# Patient Record
Sex: Female | Born: 1970 | Race: White | Hispanic: No | Marital: Married | Smoking: Never smoker
Health system: Southern US, Community
[De-identification: ages and names within clinical notes are randomized; demographics above are authoritative.]

---

## 1997-07-05 ENCOUNTER — Other Ambulatory Visit: Admission: RE | Admit: 1997-07-05 | Discharge: 1997-07-05 | Payer: Self-pay | Admitting: Obstetrics and Gynecology

## 1997-08-30 ENCOUNTER — Encounter: Admission: RE | Admit: 1997-08-30 | Discharge: 1997-11-28 | Payer: Self-pay | Admitting: Obstetrics and Gynecology

## 1998-03-01 ENCOUNTER — Inpatient Hospital Stay (HOSPITAL_COMMUNITY): Admission: AD | Admit: 1998-03-01 | Discharge: 1998-03-04 | Payer: Self-pay | Admitting: Obstetrics and Gynecology

## 1998-04-11 ENCOUNTER — Other Ambulatory Visit: Admission: RE | Admit: 1998-04-11 | Discharge: 1998-04-11 | Payer: Self-pay | Admitting: Obstetrics and Gynecology

## 1999-05-23 ENCOUNTER — Other Ambulatory Visit: Admission: RE | Admit: 1999-05-23 | Discharge: 1999-05-23 | Payer: Self-pay | Admitting: *Deleted

## 2000-02-25 ENCOUNTER — Other Ambulatory Visit: Admission: RE | Admit: 2000-02-25 | Discharge: 2000-02-25 | Payer: Self-pay | Admitting: Obstetrics and Gynecology

## 2000-09-15 ENCOUNTER — Inpatient Hospital Stay (HOSPITAL_COMMUNITY): Admission: AD | Admit: 2000-09-15 | Discharge: 2000-09-18 | Payer: Self-pay | Admitting: Obstetrics and Gynecology

## 2000-10-20 ENCOUNTER — Other Ambulatory Visit: Admission: RE | Admit: 2000-10-20 | Discharge: 2000-10-20 | Payer: Self-pay | Admitting: Obstetrics and Gynecology

## 2000-12-10 ENCOUNTER — Encounter: Admission: RE | Admit: 2000-12-10 | Discharge: 2000-12-10 | Payer: Self-pay | Admitting: Obstetrics and Gynecology

## 2000-12-10 ENCOUNTER — Encounter: Payer: Self-pay | Admitting: Obstetrics and Gynecology

## 2001-09-30 ENCOUNTER — Other Ambulatory Visit: Admission: RE | Admit: 2001-09-30 | Discharge: 2001-09-30 | Payer: Self-pay | Admitting: Obstetrics and Gynecology

## 2002-04-14 ENCOUNTER — Other Ambulatory Visit: Admission: RE | Admit: 2002-04-14 | Discharge: 2002-04-14 | Payer: Self-pay | Admitting: *Deleted

## 2002-04-14 ENCOUNTER — Other Ambulatory Visit: Admission: RE | Admit: 2002-04-14 | Discharge: 2002-04-14 | Payer: Self-pay | Admitting: Obstetrics and Gynecology

## 2002-10-14 ENCOUNTER — Other Ambulatory Visit: Admission: RE | Admit: 2002-10-14 | Discharge: 2002-10-14 | Payer: Self-pay | Admitting: Obstetrics and Gynecology

## 2003-01-31 ENCOUNTER — Other Ambulatory Visit: Admission: RE | Admit: 2003-01-31 | Discharge: 2003-01-31 | Payer: Self-pay | Admitting: Obstetrics & Gynecology

## 2003-06-13 ENCOUNTER — Other Ambulatory Visit: Admission: RE | Admit: 2003-06-13 | Discharge: 2003-06-13 | Payer: Self-pay | Admitting: Obstetrics and Gynecology

## 2004-06-07 ENCOUNTER — Encounter: Admission: RE | Admit: 2004-06-07 | Discharge: 2004-06-07 | Payer: Self-pay | Admitting: Obstetrics and Gynecology

## 2004-06-12 ENCOUNTER — Encounter: Admission: RE | Admit: 2004-06-12 | Discharge: 2004-06-12 | Payer: Self-pay | Admitting: Obstetrics and Gynecology

## 2004-06-12 ENCOUNTER — Encounter (INDEPENDENT_AMBULATORY_CARE_PROVIDER_SITE_OTHER): Payer: Self-pay | Admitting: Specialist

## 2005-01-08 ENCOUNTER — Encounter: Admission: RE | Admit: 2005-01-08 | Discharge: 2005-01-08 | Payer: Self-pay | Admitting: Obstetrics and Gynecology

## 2009-08-24 ENCOUNTER — Encounter: Admission: RE | Admit: 2009-08-24 | Discharge: 2009-08-24 | Payer: Self-pay | Admitting: Obstetrics and Gynecology

## 2010-07-13 NOTE — Op Note (Signed)
[facility]  Patient:    Morgan Cummings, Morgan Cummings                      MRN: [id number] Proc. Date: 09/15/00 Adm. Date:  [id number] Attending:  Maxie Better                           Operative Report  PREOPERATIVE DIAGNOSIS:       Pervious cesarean section, term gestation.  POSTOPERATIVE DIAGNOSES:      1. Previous cesarean section, term gestation.                               2. Omental adhesions.  OPERATION:                    1. Repeat cesarean section.                               2. Lysis of adhesions.  SURGEON:                      Sheronette A. Cherly Hensen, M.D.  ANESTHESIA:                   Spinal.  HISTORY OF PRESENT ILLNESS:   This is a 40 year old, gravida 2, para 1 female at [redacted] weeks gestation with a previous cesarean section who desires an elective repeat C section.  The patient declined vaginal birth attempt.  Risks and benefits of the procedure have been explained to the patient, and consent was signed.  The patient was transferred to the operating room.  DESCRIPTION OF PROCEDURE:     Under adequate spinal anesthesia, the patient was placed in the supine position with a left lateral tilt.  She was sterilely prepped and draped in the usual fashion.  An indwelling Foley catheter had been placed sterilely.  Marcaine 0.25% was injected along the previous scar followed by a Pfannenstiel skin incision through the previous scar, carried on to the rectus fascia.  The rectus fascia was incised in the midline and extended bilaterally using both cautery and Mayo scissors.  The rectus fascia was bluntly with cautery dissected off the muscle in a superior and inferior fashion.  The rectus muscle was subsequently split in the midline.  There was noted at the time omental adhesions to the right anterior wall along the incision line and some of the bladder peritoneum along the right anterior wall as well in the lower aspect.  These were lysed.  The  vesicouterine peritoneum was then opened and extended bilaterally.  The bladder was then bluntly dissected off the low uterine segment and displaced from the operative field using a Doyen retractor.  A curvilinear low transverse uterine incision was then made and extended bilaterally.  Artificial rupture of membranes was noted with clear fluid.  Vertex was floating which necessitated a vacuum application x 3 with subsequent delivery of a live female infant who was bulb suctioned and the abdomen cord clamped and cut.  The baby was transferred to the awaiting pediatricians who subsequently assigned Apgars of 8 and 9 at 1 and 5 minutes, respectively.  The placenta was posterior and manually removed.  The uterus was exteriorized.  The uterine cavity was cleaned of debris.  Normal tubes and ovaries were noted bilaterally.  The uterine incision had no extension and was closed in two layers.  The first layer was a running locked stitch of 0 Monocryl.  The second layer was imbricating using 0 Monocryl.  The uterus was then returned to the abdomen.  The abdomen was irrigated, suctioned of debris. Reinspection of the omentum showed additional lesions superiorly to the right anterior aspect of the abdominal wall with clearly an area for bowel loop herniation.  Using cautery, the omental adhesion was freed from that attachment entirely.  Reinspection of the uterine incision showed good hemostasis.  The bladder area was inspected.  No bleeders noted.  The vesicouterine peritoneum and parietal peritoneum were not closed.  The rectus muscles were inspected.  No bleeders were noted.  The rectus fascia was inspected.  The undersurface was noted superiorly to have a small button hole defect which was closed with 0 Vicryl figure-of-eight suture.  The rectus suture was then closed with 0 Vicryl x 2.  The skin was injected with additional 0.25% Marcaine.  The subcutaneous area was irrigated, small bleeders  cauterized, and the skin approximated using Ethicon staples. Specimen was placenta now sent to pathology.  Estimated blood loss was 700 cc. Urine output was about 150 cc with some blood tinge, otherwise clear at the end of the case.  Intraoperative fluid was 2500 cc crystalloid.  Sponge and instrument count x 2 correct.  Complications none.  The patient tolerated the procedure well and was transferred to the recovery room in stable condition. DD:  09/15/00 TD:  09/16/00 Job: [id number]

## 2010-07-13 NOTE — Discharge Summary (Signed)
[facility]  Patient:    Morgan Cummings, Morgan Cummings                      MRN: [id number] Adm. Date:  [id number] Disc. Date: [id number] Attending:  Maxie Better                           Discharge Summary  ADMISSION DIAGNOSES:          1. Previous cesarean section.                               2. Term gestation.  DISCHARGE DIAGNOSES:          1. Term gestation, delivered.                               2. Repeat cesarean section.                               3. Omental adhesions.  HISTORY OF PRESENT ILLNESS:   Please see the dictated admission history and physical exam. In essence, this is a 40 year old gravida 2, para 1-0-0-1 female at term who desires a repeat cesarean section. Her prenatal course had been uncomplicated.  HOSPITAL COURSE:              The patient was admitted on September 15, 2000. She was taken to the operating room where she underwent a repeat cesarean section via spinal anesthesia. This resulted in the delivery of a live female infant weighing 6 pounds 6 ounces, Apgars of 8 and 9. Omental adhesions were noted and were subsequently lysed. Tubes and ovaries were normal. The patient had an unremarkable postoperative course. Her CBC on postoperative day #1 showed a hemoglobin of 10.9, hematocrit of 31.6, white count of 10.7, platelet count of 185,000. By postoperative day #3 she was passing gas, tolerating a regular diet, had remained afebrile throughout her hospitalization. She was deemed well to be discharged home.  DISPOSITION:                  Home.  CONDITION:                    Stable.  DISCHARGE MEDICATIONS:        1. Tylox, #30, one p.o. q.4h. p.r.n. pain.                               2. Motrin 600 mg one p.o. q.6-8h. p.r.n. pain.                               3. Prenatal vitamins one p.o. q.d.  DISCHARGE INSTRUCTIONS:       The patient is to call for temperature greater than or equal to 100.4, nothing per vagina for four to six  weeks, no heavy lifting or driving for two weeks, call with increased incisional pain, redness, or drainage, severe abdominal pain, soaking a regular pad every hour or more frequently.  DISCHARGE FOLLOWUP:           The patient is to follow up with an appointment with [facility] in four weeks. DD:  10/01/00 TD:  10/01/00 Job: [id number]

## 2011-04-05 ENCOUNTER — Other Ambulatory Visit: Payer: Self-pay | Admitting: Obstetrics and Gynecology

## 2011-04-05 DIAGNOSIS — Z1231 Encounter for screening mammogram for malignant neoplasm of breast: Secondary | ICD-10-CM

## 2011-04-30 ENCOUNTER — Ambulatory Visit
Admission: RE | Admit: 2011-04-30 | Discharge: 2011-04-30 | Disposition: A | Discharge status: Home / Self Care | Payer: No Typology Code available for payment source | Source: Ambulatory Visit | Attending: Obstetrics and Gynecology | Admitting: Obstetrics and Gynecology

## 2011-04-30 DIAGNOSIS — Z1231 Encounter for screening mammogram for malignant neoplasm of breast: Secondary | ICD-10-CM

## 2012-04-15 ENCOUNTER — Other Ambulatory Visit: Payer: Self-pay | Admitting: Obstetrics and Gynecology

## 2012-04-15 DIAGNOSIS — Z1231 Encounter for screening mammogram for malignant neoplasm of breast: Secondary | ICD-10-CM

## 2012-05-08 ENCOUNTER — Ambulatory Visit
Admission: RE | Admit: 2012-05-08 | Discharge: 2012-05-08 | Disposition: A | Discharge status: Home / Self Care | Payer: No Typology Code available for payment source | Source: Ambulatory Visit | Attending: Obstetrics and Gynecology | Admitting: Obstetrics and Gynecology

## 2012-05-08 DIAGNOSIS — Z1231 Encounter for screening mammogram for malignant neoplasm of breast: Secondary | ICD-10-CM

## 2012-10-27 ENCOUNTER — Other Ambulatory Visit: Payer: Self-pay | Admitting: Obstetrics and Gynecology

## 2012-10-27 DIAGNOSIS — N6321 Unspecified lump in the left breast, upper outer quadrant: Secondary | ICD-10-CM

## 2012-10-27 DIAGNOSIS — N6312 Unspecified lump in the right breast, upper inner quadrant: Secondary | ICD-10-CM

## 2012-11-02 ENCOUNTER — Ambulatory Visit
Admission: RE | Admit: 2012-11-02 | Discharge: 2012-11-02 | Disposition: A | Discharge status: Home / Self Care | Payer: No Typology Code available for payment source | Source: Ambulatory Visit | Attending: Obstetrics and Gynecology | Admitting: Obstetrics and Gynecology

## 2012-11-02 DIAGNOSIS — N6321 Unspecified lump in the left breast, upper outer quadrant: Secondary | ICD-10-CM

## 2012-11-13 ENCOUNTER — Other Ambulatory Visit: Payer: No Typology Code available for payment source

## 2013-06-16 ENCOUNTER — Other Ambulatory Visit: Payer: Self-pay

## 2013-06-16 DIAGNOSIS — Z1231 Encounter for screening mammogram for malignant neoplasm of breast: Secondary | ICD-10-CM

## 2013-07-07 ENCOUNTER — Ambulatory Visit
Admission: RE | Admit: 2013-07-07 | Discharge: 2013-07-07 | Disposition: A | Discharge status: Home / Self Care | Payer: No Typology Code available for payment source | Source: Ambulatory Visit

## 2013-07-07 ENCOUNTER — Encounter (INDEPENDENT_AMBULATORY_CARE_PROVIDER_SITE_OTHER): Payer: Self-pay

## 2013-07-07 DIAGNOSIS — Z1231 Encounter for screening mammogram for malignant neoplasm of breast: Secondary | ICD-10-CM

## 2015-09-13 DIAGNOSIS — Z01419 Encounter for gynecological examination (general) (routine) without abnormal findings: Secondary | ICD-10-CM | POA: Diagnosis not present

## 2015-09-13 DIAGNOSIS — Z6820 Body mass index (BMI) 20.0-20.9, adult: Secondary | ICD-10-CM | POA: Diagnosis not present

## 2015-09-13 DIAGNOSIS — Z1151 Encounter for screening for human papillomavirus (HPV): Secondary | ICD-10-CM | POA: Diagnosis not present

## 2015-09-13 DIAGNOSIS — Z1231 Encounter for screening mammogram for malignant neoplasm of breast: Secondary | ICD-10-CM | POA: Diagnosis not present

## 2015-11-10 DIAGNOSIS — L814 Other melanin hyperpigmentation: Secondary | ICD-10-CM | POA: Diagnosis not present

## 2016-09-13 DIAGNOSIS — Z6820 Body mass index (BMI) 20.0-20.9, adult: Secondary | ICD-10-CM | POA: Diagnosis not present

## 2016-09-13 DIAGNOSIS — Z01419 Encounter for gynecological examination (general) (routine) without abnormal findings: Secondary | ICD-10-CM | POA: Diagnosis not present

## 2016-09-13 DIAGNOSIS — Z1151 Encounter for screening for human papillomavirus (HPV): Secondary | ICD-10-CM | POA: Diagnosis not present

## 2016-09-13 DIAGNOSIS — Z1231 Encounter for screening mammogram for malignant neoplasm of breast: Secondary | ICD-10-CM | POA: Diagnosis not present

## 2017-09-24 DIAGNOSIS — Z1329 Encounter for screening for other suspected endocrine disorder: Secondary | ICD-10-CM | POA: Diagnosis not present

## 2017-09-24 DIAGNOSIS — Z6820 Body mass index (BMI) 20.0-20.9, adult: Secondary | ICD-10-CM | POA: Diagnosis not present

## 2017-09-24 DIAGNOSIS — Z13220 Encounter for screening for lipoid disorders: Secondary | ICD-10-CM | POA: Diagnosis not present

## 2017-09-24 DIAGNOSIS — Z0000 Encounter for general adult medical examination without abnormal findings: Secondary | ICD-10-CM | POA: Diagnosis not present

## 2017-09-24 DIAGNOSIS — Z131 Encounter for screening for diabetes mellitus: Secondary | ICD-10-CM | POA: Diagnosis not present

## 2017-09-24 DIAGNOSIS — Z01419 Encounter for gynecological examination (general) (routine) without abnormal findings: Secondary | ICD-10-CM | POA: Diagnosis not present

## 2017-09-24 DIAGNOSIS — Z1231 Encounter for screening mammogram for malignant neoplasm of breast: Secondary | ICD-10-CM | POA: Diagnosis not present

## 2017-09-24 DIAGNOSIS — Z1151 Encounter for screening for human papillomavirus (HPV): Secondary | ICD-10-CM | POA: Diagnosis not present

## 2017-09-24 DIAGNOSIS — Z130 Encounter for screening for diseases of the blood and blood-forming organs and certain disorders involving the immune mechanism: Secondary | ICD-10-CM | POA: Diagnosis not present

## 2017-09-25 ENCOUNTER — Other Ambulatory Visit: Payer: Self-pay | Admitting: Obstetrics and Gynecology

## 2017-09-25 DIAGNOSIS — N63 Unspecified lump in unspecified breast: Secondary | ICD-10-CM

## 2017-10-03 ENCOUNTER — Ambulatory Visit
Admission: RE | Admit: 2017-10-03 | Discharge: 2017-10-03 | Disposition: A | Discharge status: Home / Self Care | Payer: No Typology Code available for payment source | Source: Ambulatory Visit | Attending: Obstetrics and Gynecology | Admitting: Obstetrics and Gynecology

## 2017-10-03 ENCOUNTER — Ambulatory Visit
Admission: RE | Admit: 2017-10-03 | Discharge: 2017-10-03 | Disposition: A | Discharge status: Home / Self Care | Payer: BLUE CROSS/BLUE SHIELD | Source: Ambulatory Visit | Attending: Obstetrics and Gynecology | Admitting: Obstetrics and Gynecology

## 2017-10-03 DIAGNOSIS — N63 Unspecified lump in unspecified breast: Secondary | ICD-10-CM

## 2017-10-03 DIAGNOSIS — N6001 Solitary cyst of right breast: Secondary | ICD-10-CM | POA: Diagnosis not present

## 2017-10-03 DIAGNOSIS — R922 Inconclusive mammogram: Secondary | ICD-10-CM | POA: Diagnosis not present

## 2017-10-20 DIAGNOSIS — N390 Urinary tract infection, site not specified: Secondary | ICD-10-CM | POA: Diagnosis not present

## 2017-10-20 DIAGNOSIS — B9620 Unspecified Escherichia coli [E. coli] as the cause of diseases classified elsewhere: Secondary | ICD-10-CM | POA: Diagnosis not present

## 2017-12-01 DIAGNOSIS — R3129 Other microscopic hematuria: Secondary | ICD-10-CM | POA: Diagnosis not present

## 2018-10-12 DIAGNOSIS — Z1151 Encounter for screening for human papillomavirus (HPV): Secondary | ICD-10-CM | POA: Diagnosis not present

## 2018-10-12 DIAGNOSIS — Z681 Body mass index (BMI) 19 or less, adult: Secondary | ICD-10-CM | POA: Diagnosis not present

## 2018-10-12 DIAGNOSIS — Z01419 Encounter for gynecological examination (general) (routine) without abnormal findings: Secondary | ICD-10-CM | POA: Diagnosis not present

## 2018-10-12 DIAGNOSIS — Z1231 Encounter for screening mammogram for malignant neoplasm of breast: Secondary | ICD-10-CM | POA: Diagnosis not present

## 2018-10-12 DIAGNOSIS — Z124 Encounter for screening for malignant neoplasm of cervix: Secondary | ICD-10-CM | POA: Diagnosis not present

## 2018-10-14 ENCOUNTER — Other Ambulatory Visit: Payer: Self-pay | Admitting: Obstetrics and Gynecology

## 2018-10-14 DIAGNOSIS — N6310 Unspecified lump in the right breast, unspecified quadrant: Secondary | ICD-10-CM

## 2018-10-23 ENCOUNTER — Other Ambulatory Visit: Payer: Self-pay

## 2018-10-23 ENCOUNTER — Ambulatory Visit
Admission: RE | Admit: 2018-10-23 | Discharge: 2018-10-23 | Disposition: A | Discharge status: Home / Self Care | Payer: BLUE CROSS/BLUE SHIELD | Source: Ambulatory Visit | Attending: Obstetrics and Gynecology | Admitting: Obstetrics and Gynecology

## 2018-10-23 DIAGNOSIS — N6001 Solitary cyst of right breast: Secondary | ICD-10-CM | POA: Diagnosis not present

## 2018-10-23 DIAGNOSIS — N6310 Unspecified lump in the right breast, unspecified quadrant: Secondary | ICD-10-CM

## 2018-10-23 DIAGNOSIS — R928 Other abnormal and inconclusive findings on diagnostic imaging of breast: Secondary | ICD-10-CM | POA: Diagnosis not present

## 2018-10-30 DIAGNOSIS — N921 Excessive and frequent menstruation with irregular cycle: Secondary | ICD-10-CM | POA: Diagnosis not present

## 2018-10-30 DIAGNOSIS — R9389 Abnormal findings on diagnostic imaging of other specified body structures: Secondary | ICD-10-CM | POA: Diagnosis not present

## 2018-11-03 DIAGNOSIS — R300 Dysuria: Secondary | ICD-10-CM | POA: Diagnosis not present

## 2018-11-04 DIAGNOSIS — N939 Abnormal uterine and vaginal bleeding, unspecified: Secondary | ICD-10-CM | POA: Diagnosis not present

## 2018-11-04 DIAGNOSIS — N840 Polyp of corpus uteri: Secondary | ICD-10-CM | POA: Diagnosis not present

## 2019-02-17 DIAGNOSIS — U071 COVID-19: Secondary | ICD-10-CM | POA: Diagnosis not present

## 2019-09-06 DIAGNOSIS — J029 Acute pharyngitis, unspecified: Secondary | ICD-10-CM | POA: Diagnosis not present

## 2019-10-04 ENCOUNTER — Other Ambulatory Visit: Payer: Self-pay | Admitting: Obstetrics and Gynecology

## 2019-10-04 DIAGNOSIS — Z1231 Encounter for screening mammogram for malignant neoplasm of breast: Secondary | ICD-10-CM

## 2019-10-25 ENCOUNTER — Ambulatory Visit
Admission: RE | Admit: 2019-10-25 | Discharge: 2019-10-25 | Disposition: A | Discharge status: Home / Self Care | Payer: BC Managed Care – PPO | Source: Ambulatory Visit | Attending: Obstetrics and Gynecology | Admitting: Obstetrics and Gynecology

## 2019-10-25 ENCOUNTER — Other Ambulatory Visit: Payer: Self-pay

## 2019-10-25 DIAGNOSIS — Z1231 Encounter for screening mammogram for malignant neoplasm of breast: Secondary | ICD-10-CM | POA: Diagnosis not present

## 2019-11-02 DIAGNOSIS — Z13220 Encounter for screening for lipoid disorders: Secondary | ICD-10-CM | POA: Diagnosis not present

## 2019-11-02 DIAGNOSIS — N939 Abnormal uterine and vaginal bleeding, unspecified: Secondary | ICD-10-CM | POA: Diagnosis not present

## 2019-11-02 DIAGNOSIS — Z1329 Encounter for screening for other suspected endocrine disorder: Secondary | ICD-10-CM | POA: Diagnosis not present

## 2019-11-02 DIAGNOSIS — Z6820 Body mass index (BMI) 20.0-20.9, adult: Secondary | ICD-10-CM | POA: Diagnosis not present

## 2019-11-02 DIAGNOSIS — N390 Urinary tract infection, site not specified: Secondary | ICD-10-CM | POA: Diagnosis not present

## 2019-11-02 DIAGNOSIS — Z131 Encounter for screening for diabetes mellitus: Secondary | ICD-10-CM | POA: Diagnosis not present

## 2019-11-02 DIAGNOSIS — Z01419 Encounter for gynecological examination (general) (routine) without abnormal findings: Secondary | ICD-10-CM | POA: Diagnosis not present

## 2019-11-02 DIAGNOSIS — Z130 Encounter for screening for diseases of the blood and blood-forming organs and certain disorders involving the immune mechanism: Secondary | ICD-10-CM | POA: Diagnosis not present

## 2019-11-02 DIAGNOSIS — Z0000 Encounter for general adult medical examination without abnormal findings: Secondary | ICD-10-CM | POA: Diagnosis not present

## 2019-11-02 DIAGNOSIS — G43909 Migraine, unspecified, not intractable, without status migrainosus: Secondary | ICD-10-CM | POA: Diagnosis not present

## 2019-11-22 DIAGNOSIS — N921 Excessive and frequent menstruation with irregular cycle: Secondary | ICD-10-CM | POA: Diagnosis not present

## 2019-11-22 DIAGNOSIS — N939 Abnormal uterine and vaginal bleeding, unspecified: Secondary | ICD-10-CM | POA: Diagnosis not present

## 2020-05-09 DIAGNOSIS — Z13228 Encounter for screening for other metabolic disorders: Secondary | ICD-10-CM | POA: Diagnosis not present

## 2020-09-19 ENCOUNTER — Other Ambulatory Visit: Payer: Self-pay | Admitting: Obstetrics and Gynecology

## 2020-09-19 DIAGNOSIS — Z1231 Encounter for screening mammogram for malignant neoplasm of breast: Secondary | ICD-10-CM

## 2020-11-02 DIAGNOSIS — N3020 Other chronic cystitis without hematuria: Secondary | ICD-10-CM | POA: Diagnosis not present

## 2020-11-06 DIAGNOSIS — Z131 Encounter for screening for diabetes mellitus: Secondary | ICD-10-CM | POA: Diagnosis not present

## 2020-11-06 DIAGNOSIS — Z1329 Encounter for screening for other suspected endocrine disorder: Secondary | ICD-10-CM | POA: Diagnosis not present

## 2020-11-06 DIAGNOSIS — Z13220 Encounter for screening for lipoid disorders: Secondary | ICD-10-CM | POA: Diagnosis not present

## 2020-11-06 DIAGNOSIS — Z0000 Encounter for general adult medical examination without abnormal findings: Secondary | ICD-10-CM | POA: Diagnosis not present

## 2020-11-06 DIAGNOSIS — Z6820 Body mass index (BMI) 20.0-20.9, adult: Secondary | ICD-10-CM | POA: Diagnosis not present

## 2020-11-06 DIAGNOSIS — Z01419 Encounter for gynecological examination (general) (routine) without abnormal findings: Secondary | ICD-10-CM | POA: Diagnosis not present

## 2020-11-10 ENCOUNTER — Ambulatory Visit
Admission: RE | Admit: 2020-11-10 | Discharge: 2020-11-10 | Disposition: A | Discharge status: Home / Self Care | Payer: BC Managed Care – PPO | Source: Ambulatory Visit | Attending: Obstetrics and Gynecology | Admitting: Obstetrics and Gynecology

## 2020-11-10 ENCOUNTER — Other Ambulatory Visit: Payer: Self-pay

## 2020-11-10 DIAGNOSIS — Z1231 Encounter for screening mammogram for malignant neoplasm of breast: Secondary | ICD-10-CM

## 2020-12-19 DIAGNOSIS — E875 Hyperkalemia: Secondary | ICD-10-CM | POA: Diagnosis not present

## 2021-01-01 LAB — HM COLONOSCOPY

## 2021-01-11 DIAGNOSIS — Z800 Family history of malignant neoplasm of digestive organs: Secondary | ICD-10-CM | POA: Diagnosis not present

## 2021-01-11 DIAGNOSIS — K5900 Constipation, unspecified: Secondary | ICD-10-CM | POA: Diagnosis not present

## 2021-01-11 DIAGNOSIS — Z1211 Encounter for screening for malignant neoplasm of colon: Secondary | ICD-10-CM | POA: Diagnosis not present

## 2021-04-13 DIAGNOSIS — Z1211 Encounter for screening for malignant neoplasm of colon: Secondary | ICD-10-CM | POA: Diagnosis not present

## 2021-04-13 DIAGNOSIS — Z800 Family history of malignant neoplasm of digestive organs: Secondary | ICD-10-CM | POA: Diagnosis not present

## 2021-04-18 DIAGNOSIS — N63 Unspecified lump in unspecified breast: Secondary | ICD-10-CM | POA: Diagnosis not present

## 2021-04-20 ENCOUNTER — Other Ambulatory Visit: Payer: Self-pay | Admitting: Obstetrics and Gynecology

## 2021-04-20 DIAGNOSIS — N6320 Unspecified lump in the left breast, unspecified quadrant: Secondary | ICD-10-CM

## 2021-05-08 ENCOUNTER — Ambulatory Visit: Payer: BC Managed Care – PPO | Admitting: Nurse Practitioner

## 2021-05-08 ENCOUNTER — Other Ambulatory Visit: Payer: Self-pay

## 2021-05-08 ENCOUNTER — Encounter: Payer: Self-pay | Admitting: Nurse Practitioner

## 2021-05-08 VITALS — BP 106/64 | HR 87 | Temp 97.8°F | Ht 63.6 in | Wt 122.6 lb

## 2021-05-08 DIAGNOSIS — Z800 Family history of malignant neoplasm of digestive organs: Secondary | ICD-10-CM

## 2021-05-08 DIAGNOSIS — Z0000 Encounter for general adult medical examination without abnormal findings: Secondary | ICD-10-CM

## 2021-05-08 NOTE — Progress Notes (Addendum)
? ?Subjective:  ? ? Patient ID: Morgan Cummings, female    DOB: April 27, 1970, 51 y.o.   MRN: [id number] ? ?Patient presents today for CPE (new patient)  ? ?HPI ?FHx: colon cancer ?last done 04/16/2021 by Dr. Loreta AveMann, normal per patient ?Report requested ?Labs completed by GYN 10/2020: CBC, CMP, lipid panel ? ?Vision:up to date, corrective lens ?Dental: up to date ?Exercise:4x/week: cardio and weight training ?Weight:  ?Wt Readings from Last 3 Encounters:  ?05/08/21 122 lb 9.6 oz (55.6 kg)  ? ?Sexual History: ?STD SCREEN:declined ?Mammogram: up to date by GYN ?Cervical cancer: up to date, by GYN ? ?Depression/Suicide: ?Depression screen [facility] 2/9 05/08/2021  ?Decreased Interest 0  ?Down, Depressed, Hopeless 0  ?PHQ - 2 Score 0  ?Altered sleeping 1  ?Tired, decreased energy 0  ?Change in appetite 0  ?Feeling bad or failure about yourself  0  ?Trouble concentrating 0  ?Moving slowly or fidgety/restless 0  ?Suicidal thoughts 0  ?PHQ-9 Score 1  ?Difficult doing work/chores Not difficult at all  ? ?Immunizations: (TDAP, Hep C screen, Pneumovax, Influenza, zoster)  ?Health Maintenance  ?Topic Date Due  ? Tetanus Vaccine  Never done  ? Pap Smear  Never done  ? COVID-19 Vaccine (5 - Booster for Moderna series) 02/02/2021  ? Zoster (Shingles) Vaccine (1 of 2) 08/08/2021*  ? HIV Screening  05/09/2022*  ? Mammogram  11/11/2022  ? Colon Cancer Screening  04/13/2028  ? Flu Shot  Completed  ? HPV Vaccine  Aged Out  ? Hepatitis C Screening: USPSTF Recommendation to screen - Ages 6318-79 yo.  Discontinued  ?*Topic was postponed. The date shown is not the original due date.  ? ?Fall Risk: ?Fall Risk  05/08/2021  ?Falls in the past year? 1  ?Number falls in past yr: 0  ?Injury with Fall? 1  ?Risk for fall due to : History of fall(s)  ?Follow up Falls evaluation completed  ? ?Medications and allergies reviewed with patient and updated if appropriate. ? ?Patient Active Problem List  ? Diagnosis Date Noted  ? FHx: colon cancer 05/08/2021  ? ?Current  Outpatient Medications on File Prior to Visit  ?Medication Sig Dispense Refill  ? SUMAtriptan (IMITREX) 50 MG tablet Take by mouth.    ? ?No current facility-administered medications on file prior to visit.  ? ?History reviewed. No pertinent past medical history. ? ?History reviewed. No pertinent surgical history. ? ?Social History  ? ?Socioeconomic History  ? Marital status: Married  ?  Spouse name: Not on file  ? Number of children: 2  ? Years of education: Not on file  ? Highest education level: Not on file  ?Occupational History  ? Not on file  ?Tobacco Use  ? Smoking status: Never  ? Smokeless tobacco: Never  ?Substance and Sexual Activity  ? Alcohol use: Yes  ?  Comment: occassionally  ? Drug use: Never  ? Sexual activity: Yes  ?  Birth control/protection: None  ?Other Topics Concern  ? Not on file  ?Social History Narrative  ? Not on file  ? ?Social Determinants of Health  ? ?Financial Resource Strain: Not on file  ?Food Insecurity: Not on file  ?Transportation Needs: Not on file  ?Physical Activity: Not on file  ?Stress: Not on file  ?Social Connections: Not on file  ? ? ?Family History  ?Problem Relation Age of Onset  ? Cancer Mother   ?     kidney  ? Hyperlipidemia Father   ? Heart disease  Father 70  ?     CAD with CABG  ? Hypertension Father   ? Cancer Father   ?     prostate  ? Cancer Paternal Uncle 63  ?     colon cancer  ? Cancer Paternal Grandmother 18  ?     colon cancer  ? Obesity Paternal Grandfather   ? Alcohol abuse Paternal Grandfather   ? Breast cancer Neg Hx   ? ?   ? ?Review of Systems  ?Constitutional:  Negative for fever, malaise/fatigue and weight loss.  ?HENT:  Negative for congestion and sore throat.   ?Eyes:   ?     Negative for visual changes  ?Respiratory:  Negative for cough and shortness of breath.   ?Cardiovascular:  Negative for chest pain, palpitations and leg swelling.  ?Gastrointestinal:  Negative for blood in stool, constipation, diarrhea and heartburn.  ?Genitourinary:   Negative for dysuria, frequency and urgency.  ?Musculoskeletal:  Negative for falls, joint pain and myalgias.  ?Skin:  Negative for rash.  ?Neurological:  Negative for dizziness, sensory change and headaches.  ?Endo/Heme/Allergies:  Does not bruise/bleed easily.  ?Psychiatric/Behavioral:  Negative for depression, substance abuse and suicidal ideas. The patient is not nervous/anxious and does not have insomnia.   ? ?Objective:  ? ?Vitals:  ? 05/08/21 0912  ?BP: 106/64  ?Pulse: 87  ?Temp: 97.8 ?F (36.6 ?C)  ?SpO2: 99%  ? ?Body mass index is 21.31 kg/m?. ? ?Physical Examination: ? ?Physical Exam ?Constitutional:   ?   General: She is not in acute distress. ?HENT:  ?   Right Ear: Tympanic membrane, ear canal and external ear normal.  ?   Left Ear: Tympanic membrane, ear canal and external ear normal.  ?Eyes:  ?   General: No scleral icterus. ?   Extraocular Movements: Extraocular movements intact.  ?   Conjunctiva/sclera: Conjunctivae normal.  ?Cardiovascular:  ?   Rate and Rhythm: Normal rate and regular rhythm.  ?   Pulses: Normal pulses.  ?   Heart sounds: Normal heart sounds.  ?Pulmonary:  ?   Effort: Pulmonary effort is normal. No respiratory distress.  ?   Breath sounds: Normal breath sounds.  ?Abdominal:  ?   General: Bowel sounds are normal. There is no distension.  ?   Palpations: Abdomen is soft.  ?Genitourinary: ?   Comments: Deferred breast and pelvic exam to GYN ?Musculoskeletal:     ?   General: Normal range of motion.  ?   Cervical back: Normal range of motion and neck supple.  ?   Right lower leg: No edema.  ?   Left lower leg: No edema.  ?Lymphadenopathy:  ?   Cervical: No cervical adenopathy.  ?Skin: ?   General: Skin is warm and dry.  ?Neurological:  ?   Mental Status: She is alert and oriented to person, place, and time.  ?Psychiatric:     ?   Mood and Affect: Mood normal.     ?   Behavior: Behavior normal.     ?   Thought Content: Thought content normal.  ? ?ASSESSMENT and PLAN: ?This visit occurred  during the SARS-CoV-2 public health emergency.  Safety protocols were in place, including screening questions prior to the visit, additional usage of staff PPE, and extensive cleaning of exam room while observing appropriate contact time as indicated for disinfecting solutions.  ? ?Yuridia was seen today for establish care. ? ?Diagnoses and all orders for this visit: ? ?Preventative health  care ? ?FHx: colon cancer ? ?Labs completed by GYN within the last 39months (normal per patient). Records requested ? ?  ? ?Problem List Items Addressed This Visit   ? ?  ? Other  ? FHx: colon cancer  ?  last done 04/16/2021 by Dr. Loreta Ave, normal per patient ?Report requested ?  ?  ? ?Other Visit Diagnoses   ? ? Preventative health care    -  Primary  ? ?  ?  ? ?Follow up: Return in about 1 year (around 05/09/2022) for CPE (fasting). ? ?Alysia Penna, NP ?

## 2021-05-08 NOTE — Assessment & Plan Note (Signed)
last done 04/16/2021 by Dr. Collene Mares, normal per patient ?Report requested ?

## 2021-05-08 NOTE — Patient Instructions (Signed)
Thank you for choosing [facility] ? ?Sign medical release form to get your records from Dr. Collene Mares and Dr. Lanny Cramp. ? ?Preventive Care 64-50 Years Old, Female ?Preventive care refers to lifestyle choices and visits with your health care provider that can promote health and wellness. Preventive care visits are also called wellness exams. ?What can I expect for my preventive care visit? ?Counseling ?Your health care provider may ask you questions about your: ?Medical history, including: ?Past medical problems. ?Family medical history. ?Pregnancy history. ?Current health, including: ?Menstrual cycle. ?Method of birth control. ?Emotional well-being. ?Home life and relationship well-being. ?Sexual activity and sexual health. ?Lifestyle, including: ?Alcohol, nicotine or tobacco, and drug use. ?Access to firearms. ?Diet, exercise, and sleep habits. ?Work and work Statistician. ?Sunscreen use. ?Safety issues such as seatbelt and bike helmet use. ?Physical exam ?Your health care provider will check your: ?Height and weight. These may be used to calculate your BMI (body mass index). BMI is a measurement that tells if you are at a healthy weight. ?Waist circumference. This measures the distance around your waistline. This measurement also tells if you are at a healthy weight and may help predict your risk of certain diseases, such as type 2 diabetes and high blood pressure. ?Heart rate and blood pressure. ?Body temperature. ?Skin for abnormal spots. ?What immunizations do I need? ?Vaccines are usually given at various ages, according to a schedule. Your health care provider will recommend vaccines for you based on your age, medical history, and lifestyle or other factors, such as travel or where you work. ?What tests do I need? ?Screening ?Your health care provider may recommend screening tests for certain conditions. This may include: ?Lipid and cholesterol levels. ?Diabetes screening. This is done by checking your blood  sugar (glucose) after you have not eaten for a while (fasting). ?Pelvic exam and Pap test. ?Hepatitis B test. ?Hepatitis C test. ?HIV (human immunodeficiency virus) test. ?STI (sexually transmitted infection) testing, if you are at risk. ?Lung cancer screening. ?Colorectal cancer screening. ?Mammogram. Talk with your health care provider about when you should start having regular mammograms. This may depend on whether you have a family history of breast cancer. ?BRCA-related cancer screening. This may be done if you have a family history of breast, ovarian, tubal, or peritoneal cancers. ?Bone density scan. This is done to screen for osteoporosis. ?Talk with your health care provider about your test results, treatment options, and if necessary, the need for more tests. ?Follow these instructions at home: ?Eating and drinking ? ?Eat a diet that includes fresh fruits and vegetables, whole grains, lean protein, and low-fat dairy products. ?Take vitamin and mineral supplements as recommended by your health care provider. ?Do not drink alcohol if: ?Your health care provider tells you not to drink. ?You are pregnant, may be pregnant, or are planning to become pregnant. ?If you drink alcohol: ?Limit how much you have to 0-1 drink a day. ?Know how much alcohol is in your drink. In the [place] one drink equals one 12 oz bottle of beer (355 mL), one 5 oz glass of wine (148 mL), or one 1? oz glass of hard liquor (44 mL). ?Lifestyle ?Brush your teeth every morning and night with fluoride toothpaste. Floss one time each day. ?Exercise for at least 30 minutes 5 or more days each week. ?Do not use any products that contain nicotine or tobacco. These products include cigarettes, chewing tobacco, and vaping devices, such as e-cigarettes. If you need help quitting, ask your health care  provider. ?Do not use drugs. ?If you are sexually active, practice safe sex. Use a condom or other form of protection to prevent STIs. ?If you do not  wish to become pregnant, use a form of birth control. If you plan to become pregnant, see your health care provider for a prepregnancy visit. ?Take aspirin only as told by your health care provider. Make sure that you understand how much to take and what form to take. Work with your health care provider to find out whether it is safe and beneficial for you to take aspirin daily. ?Find healthy ways to manage stress, such as: ?Meditation, yoga, or listening to music. ?Journaling. ?Talking to a trusted person. ?Spending time with friends and family. ?Minimize exposure to UV radiation to reduce your risk of skin cancer. ?Safety ?Always wear your seat belt while driving or riding in a vehicle. ?Do not drive: ?If you have been drinking alcohol. Do not ride with someone who has been drinking. ?When you are tired or distracted. ?While texting. ?If you have been using any mind-altering substances or drugs. ?Wear a helmet and other protective equipment during sports activities. ?If you have firearms in your house, make sure you follow all gun safety procedures. ?Seek help if you have been physically or sexually abused. ?What's next? ?Visit your health care provider once a year for an annual wellness visit. ?Ask your health care provider how often you should have your eyes and teeth checked. ?Stay up to date on all vaccines. ?This information is not intended to replace advice given to you by your health care provider. Make sure you discuss any questions you have with your health care provider. ?Document Revised: 08/09/2020 Document Reviewed: 08/09/2020 ?Elsevier Patient Education ? [employer]. ? ?

## 2021-05-10 ENCOUNTER — Ambulatory Visit
Admission: RE | Admit: 2021-05-10 | Discharge: 2021-05-10 | Disposition: A | Discharge status: Home / Self Care | Payer: BC Managed Care – PPO | Source: Ambulatory Visit | Attending: Obstetrics and Gynecology | Admitting: Obstetrics and Gynecology

## 2021-05-10 DIAGNOSIS — N6002 Solitary cyst of left breast: Secondary | ICD-10-CM | POA: Diagnosis not present

## 2021-05-10 DIAGNOSIS — R922 Inconclusive mammogram: Secondary | ICD-10-CM | POA: Diagnosis not present

## 2021-11-05 DIAGNOSIS — N3020 Other chronic cystitis without hematuria: Secondary | ICD-10-CM | POA: Diagnosis not present

## 2021-11-15 DIAGNOSIS — Z0142 Encounter for cervical smear to confirm findings of recent normal smear following initial abnormal smear: Secondary | ICD-10-CM | POA: Diagnosis not present

## 2021-11-15 DIAGNOSIS — Z01419 Encounter for gynecological examination (general) (routine) without abnormal findings: Secondary | ICD-10-CM | POA: Diagnosis not present

## 2021-11-15 DIAGNOSIS — Z01411 Encounter for gynecological examination (general) (routine) with abnormal findings: Secondary | ICD-10-CM | POA: Diagnosis not present

## 2021-11-15 DIAGNOSIS — Z124 Encounter for screening for malignant neoplasm of cervix: Secondary | ICD-10-CM | POA: Diagnosis not present

## 2021-12-11 ENCOUNTER — Other Ambulatory Visit: Payer: Self-pay | Admitting: Obstetrics and Gynecology

## 2021-12-11 DIAGNOSIS — Z1231 Encounter for screening mammogram for malignant neoplasm of breast: Secondary | ICD-10-CM

## 2022-01-28 ENCOUNTER — Ambulatory Visit: Payer: BC Managed Care – PPO

## 2022-01-28 ENCOUNTER — Ambulatory Visit
Admission: RE | Admit: 2022-01-28 | Discharge: 2022-01-28 | Disposition: A | Discharge status: Home / Self Care | Payer: BC Managed Care – PPO | Source: Ambulatory Visit | Attending: Obstetrics and Gynecology | Admitting: Obstetrics and Gynecology

## 2022-01-28 DIAGNOSIS — Z1231 Encounter for screening mammogram for malignant neoplasm of breast: Secondary | ICD-10-CM

## 2022-10-03 ENCOUNTER — Ambulatory Visit (INDEPENDENT_AMBULATORY_CARE_PROVIDER_SITE_OTHER): Payer: BC Managed Care – PPO | Admitting: Nurse Practitioner

## 2022-10-03 ENCOUNTER — Encounter: Payer: Self-pay | Admitting: Nurse Practitioner

## 2022-10-03 VITALS — BP 110/82 | HR 71 | Temp 98.8°F | Resp 16 | Ht 63.6 in | Wt 128.6 lb

## 2022-10-03 DIAGNOSIS — F518 Other sleep disorders not due to a substance or known physiological condition: Secondary | ICD-10-CM

## 2022-10-03 DIAGNOSIS — Z0001 Encounter for general adult medical examination with abnormal findings: Secondary | ICD-10-CM

## 2022-10-03 DIAGNOSIS — Z23 Encounter for immunization: Secondary | ICD-10-CM

## 2022-10-03 DIAGNOSIS — Z13220 Encounter for screening for lipoid disorders: Secondary | ICD-10-CM | POA: Diagnosis not present

## 2022-10-03 DIAGNOSIS — G4720 Circadian rhythm sleep disorder, unspecified type: Secondary | ICD-10-CM

## 2022-10-03 DIAGNOSIS — Z136 Encounter for screening for cardiovascular disorders: Secondary | ICD-10-CM | POA: Diagnosis not present

## 2022-10-03 LAB — COMPREHENSIVE METABOLIC PANEL
ALT: 17 U/L (ref 0–35)
AST: 21 U/L (ref 0–37)
Albumin: 4.4 g/dL (ref 3.5–5.2)
Alkaline Phosphatase: 79 U/L (ref 39–117)
BUN: 20 mg/dL (ref 6–23)
CO2: 29 mEq/L (ref 19–32)
Calcium: 9.1 mg/dL (ref 8.4–10.5)
Chloride: 105 mEq/L (ref 96–112)
Creatinine, Ser: 0.72 mg/dL (ref 0.40–1.20)
GFR: 96.71 mL/min (ref >60.00)
Glucose, Bld: 88 mg/dL (ref 70–99)
Potassium: 4.2 mEq/L (ref 3.5–5.1)
Sodium: 140 mEq/L (ref 135–145)
Total Bilirubin: 0.5 mg/dL (ref 0.2–1.2)
Total Protein: 6.7 g/dL (ref 6.0–8.3)

## 2022-10-03 LAB — LIPID PANEL
Cholesterol: 150 mg/dL (ref 0–200)
HDL: 53.3 mg/dL (ref >39.00)
LDL Cholesterol: 86 mg/dL (ref 0–99)
NonHDL: 96.7
Total CHOL/HDL Ratio: 3
Triglycerides: 55 mg/dL (ref 0.0–149.0)
VLDL: 11 mg/dL (ref 0.0–40.0)

## 2022-10-03 NOTE — Patient Instructions (Addendum)
Go to lab Continue Heart healthy diet and daily exercise. Try magnesium glycinate for sleep  Preventive Care 51-51 Years Old, Female Preventive care refers to lifestyle choices and visits with your health care provider that can promote health and wellness. Preventive care visits are also called wellness exams. What can I expect for my preventive care visit? Counseling Your health care provider may ask you questions about your: Medical history, including: Past medical problems. Family medical history. Pregnancy history. Current health, including: Menstrual cycle. Method of birth control. Emotional well-being. Home life and relationship well-being. Sexual activity and sexual health. Lifestyle, including: Alcohol, nicotine or tobacco, and drug use. Access to firearms. Diet, exercise, and sleep habits. Work and work Astronomer. Sunscreen use. Safety issues such as seatbelt and bike helmet use. Physical exam Your health care provider will check your: Height and weight. These may be used to calculate your BMI (body mass index). BMI is a measurement that tells if you are at a healthy weight. Waist circumference. This measures the distance around your waistline. This measurement also tells if you are at a healthy weight and may help predict your risk of certain diseases, such as type 2 diabetes and high blood pressure. Heart rate and blood pressure. Body temperature. Skin for abnormal spots. What immunizations do I need?  Vaccines are usually given at various ages, according to a schedule. Your health care provider will recommend vaccines for you based on your age, medical history, and lifestyle or other factors, such as travel or where you work. What tests do I need? Screening Your health care provider may recommend screening tests for certain conditions. This may include: Lipid and cholesterol levels. Diabetes screening. This is done by checking your blood sugar (glucose) after you  have not eaten for a while (fasting). Pelvic exam and Pap test. Hepatitis B test. Hepatitis C test. HIV (human immunodeficiency virus) test. STI (sexually transmitted infection) testing, if you are at risk. Lung cancer screening. Colorectal cancer screening. Mammogram. Talk with your health care provider about when you should start having regular mammograms. This may depend on whether you have a family history of breast cancer. BRCA-related cancer screening. This may be done if you have a family history of breast, ovarian, tubal, or peritoneal cancers. Bone density scan. This is done to screen for osteoporosis. Talk with your health care provider about your test results, treatment options, and if necessary, the need for more tests. Follow these instructions at home: Eating and drinking  Eat a diet that includes fresh fruits and vegetables, whole grains, lean protein, and low-fat dairy products. Take vitamin and mineral supplements as recommended by your health care provider. Do not drink alcohol if: Your health care provider tells you not to drink. You are pregnant, may be pregnant, or are planning to become pregnant. If you drink alcohol: Limit how much you have to 0-1 drink a day. Know how much alcohol is in your drink. In the [place] one drink equals one 12 oz bottle of beer (355 mL), one 5 oz glass of wine (148 mL), or one 1 oz glass of hard liquor (44 mL). Lifestyle Brush your teeth every morning and night with fluoride toothpaste. Floss one time each day. Exercise for at least 30 minutes 5 or more days each week. Do not use any products that contain nicotine or tobacco. These products include cigarettes, chewing tobacco, and vaping devices, such as e-cigarettes. If you need help quitting, ask your health care provider. Do not use drugs. If  you are sexually active, practice safe sex. Use a condom or other form of protection to prevent STIs. If you do not wish to become pregnant, use  a form of birth control. If you plan to become pregnant, see your health care provider for a prepregnancy visit. Take aspirin only as told by your health care provider. Make sure that you understand how much to take and what form to take. Work with your health care provider to find out whether it is safe and beneficial for you to take aspirin daily. Find healthy ways to manage stress, such as: Meditation, yoga, or listening to music. Journaling. Talking to a trusted person. Spending time with friends and family. Minimize exposure to UV radiation to reduce your risk of skin cancer. Safety Always wear your seat belt while driving or riding in a vehicle. Do not drive: If you have been drinking alcohol. Do not ride with someone who has been drinking. When you are tired or distracted. While texting. If you have been using any mind-altering substances or drugs. Wear a helmet and other protective equipment during sports activities. If you have firearms in your house, make sure you follow all gun safety procedures. Seek help if you have been physically or sexually abused. What's next? Visit your health care provider once a year for an annual wellness visit. Ask your health care provider how often you should have your eyes and teeth checked. Stay up to date on all vaccines. This information is not intended to replace advice given to you by your health care provider. Make sure you discuss any questions you have with your health care provider. Document Revised: 08/09/2020 Document Reviewed: 08/09/2020 Elsevier Patient Education  2024 [employer].

## 2022-10-03 NOTE — Progress Notes (Signed)
Complete physical exam  Patient: Morgan Cummings   DOB: [birth year]   52 y.o. Female  MRN: [id number] Visit Date: 10/03/2022  Subjective:    Chief Complaint  Patient presents with   Annual Exam    Fasting    Morgan Cummings is a 52 y.o. female who presents today for a complete physical exam. She reports consuming a general diet.  Walking and strength training 5x/week  She generally feels well. She reports sleeping poorly. She does have additional problems to discuss today.  Vision:Yes Dental:Yes STD Screen:No  BP Readings from Last 3 Encounters:  10/03/22 110/82  05/08/21 106/64   Wt Readings from Last 3 Encounters:  10/03/22 128 lb 9.6 oz (58.3 kg)  05/08/21 122 lb 9.6 oz (55.6 kg)   Most recent fall risk assessment:    10/03/2022    9:57 AM  Fall Risk   Falls in the past year? 0  Number falls in past yr: 0  Injury with Fall? 0  Risk for fall due to : No Fall Risks  Follow up Falls evaluation completed   Depression screen:Yes - No Depression Most recent depression screenings:    10/03/2022    9:57 AM 05/08/2021    9:22 AM  PHQ 2/9 Scores  PHQ - 2 Score 0 0  PHQ- 9 Score 3 1    HPI  Disrupted sleep-wake cycle Chronic, no difficulty falling asleep, wakes up between 1-3am every night and unable to return to sleep, associated with intermittent restless legs. She has a bed-partner who snores, use of earplugs with some improvement.  no improvement with melatonin, and perimenopause OVER THE COUNTER supplement. No tobacco/nicotine, no ALCOHOL, no caffeine No daytime somnolence, no snoring, no apneic episodes, no HYPERTENSION, no large neck circumference.  We discussed use of hynoptic and possible side effects. She opted to use magnesium glycinate OVER THE COUNTER supplement at this time. F/up prn   History reviewed. No pertinent past medical history. Past Surgical History:  Procedure Laterality Date   CESAREAN SECTION  03/01/1998   Social History   Socioeconomic  History   Marital status: Married    Spouse name: Not on file   Number of children: 2   Years of education: Not on file   Highest education level: Not on file  Occupational History   Not on file  Tobacco Use   Smoking status: Never   Smokeless tobacco: Never  Substance and Sexual Activity   Alcohol use: Yes    Alcohol/week: 2.0 standard drinks of alcohol    Types: 2 Cans of beer per week    Comment: occassionally   Drug use: Never   Sexual activity: Yes    Birth control/protection: Other-see comments, None    Comment: Husband had vascectomy  Other Topics Concern   Not on file  Social History Narrative   Not on file   Social Determinants of Health   Financial Resource Strain: Not on file  Food Insecurity: Not on file  Transportation Needs: Not on file  Physical Activity: Not on file  Stress: Not on file  Social Connections: Not on file  Intimate Partner Violence: Not on file   Family Status  Relation Name Status   Mother [place] Alive   Father [place] Alive   Sister  Alive   Pat Uncle Ed Deceased   MGM  Deceased   MGF  Deceased   PGM Josephine Deceased   PGF Grandpa Deceased   Neg Hx  (Not Specified)  No partnership data on file   Family History  Problem Relation Age of Onset   Cancer Mother        kidney   Hyperlipidemia Father    Heart disease Father 40       CAD with CABG   Hypertension Father    Cancer Father        prostate   Cancer Paternal Uncle 10       colon cancer   Cancer Paternal Grandmother 11       colon cancer   Obesity Paternal Grandfather    Alcohol abuse Paternal Grandfather    Breast cancer Neg Hx    Allergies  Allergen Reactions   Codeine Nausea And Vomiting   Sulfa Antibiotics Other (See Comments)    Childhood reaction,    Penicillins Rash    Patient Care Team: Farhiya Rosten, Bonna Gains, NP as PCP - General (Internal Medicine) Toy Baker, DO as Consulting Physician (Obstetrics and Gynecology)   Medications: Outpatient  Medications Prior to Visit  Medication Sig   SUMAtriptan (IMITREX) 50 MG tablet Take by mouth.   No facility-administered medications prior to visit.    Review of Systems  Constitutional:  Negative for activity change, appetite change and unexpected weight change.  Respiratory: Negative.    Cardiovascular: Negative.   Gastrointestinal: Negative.   Endocrine: Negative for cold intolerance and heat intolerance.  Genitourinary: Negative.   Musculoskeletal: Negative.   Skin: Negative.   Neurological: Negative.   Hematological: Negative.   Psychiatric/Behavioral:  Positive for sleep disturbance. Negative for behavioral problems, decreased concentration, dysphoric mood, hallucinations, self-injury and suicidal ideas. The patient is not nervous/anxious.         Objective:  BP 110/82 (BP Location: Left Arm, Patient Position: Sitting, Cuff Size: Normal)   Pulse 71   Temp 98.8 F (37.1 C) (Temporal)   Resp 16   Ht 5' 3.6" (1.615 m)   Wt 128 lb 9.6 oz (58.3 kg)   LMP 05/19/2022 (Exact Date)   SpO2 100%   BMI 22.35 kg/m     Physical Exam Vitals and nursing note reviewed.  Constitutional:      General: She is not in acute distress. HENT:     Right Ear: Tympanic membrane, ear canal and external ear normal.     Left Ear: Tympanic membrane, ear canal and external ear normal.     Nose: Nose normal.  Eyes:     Extraocular Movements: Extraocular movements intact.     Conjunctiva/sclera: Conjunctivae normal.     Pupils: Pupils are equal, round, and reactive to light.  Neck:     Thyroid: No thyroid mass, thyromegaly or thyroid tenderness.  Cardiovascular:     Rate and Rhythm: Normal rate and regular rhythm.     Pulses: Normal pulses.     Heart sounds: Normal heart sounds.  Pulmonary:     Effort: Pulmonary effort is normal.     Breath sounds: Normal breath sounds.  Abdominal:     General: Bowel sounds are normal.     Palpations: Abdomen is soft.  Musculoskeletal:         General: Normal range of motion.     Cervical back: Normal range of motion and neck supple.     Right lower leg: No edema.     Left lower leg: No edema.  Lymphadenopathy:     Cervical: No cervical adenopathy.  Skin:    General: Skin is warm and dry.  Neurological:  Mental Status: She is alert and oriented to person, place, and time.     Cranial Nerves: No cranial nerve deficit.  Psychiatric:        Mood and Affect: Mood normal.        Behavior: Behavior normal.        Thought Content: Thought content normal.     Results for orders placed or performed in visit on 10/03/22  Comprehensive metabolic panel  Result Value Ref Range   Sodium 140 135 - 145 mEq/L   Potassium 4.2 3.5 - 5.1 mEq/L   Chloride 105 96 - 112 mEq/L   CO2 29 19 - 32 mEq/L   Glucose, Bld 88 70 - 99 mg/dL   BUN 20 6 - 23 mg/dL   Creatinine, Ser 1.02 0.40 - 1.20 mg/dL   Total Bilirubin 0.5 0.2 - 1.2 mg/dL   Alkaline Phosphatase 79 39 - 117 U/L   AST 21 0 - 37 U/L   ALT 17 0 - 35 U/L   Total Protein 6.7 6.0 - 8.3 g/dL   Albumin 4.4 3.5 - 5.2 g/dL   GFR 72.53 >66.44 mL/min   Calcium 9.1 8.4 - 10.5 mg/dL  Lipid panel  Result Value Ref Range   Cholesterol 150 0 - 200 mg/dL   Triglycerides 03.4 0.0 - 149.0 mg/dL   HDL 74.25 >95.63 mg/dL   VLDL 87.5 0.0 - 64.3 mg/dL   LDL Cholesterol 86 0 - 99 mg/dL   Total CHOL/HDL Ratio 3    NonHDL 96.70       Assessment & Plan:    Routine Health Maintenance and Physical Exam  Immunization History  Administered Date(s) Administered   Influenza-Unspecified 11/25/2020   Moderna Sars-Covid-2 Vaccination 05/05/2019, 06/02/2019, 03/04/2020, 12/08/2020   Tdap 10/03/2022   Health Maintenance  Topic Date Due   PAP SMEAR-Modifier  Never done   COVID-19 Vaccine (5 - 2023-24 season) 10/26/2021   INFLUENZA VACCINE  09/26/2022   Zoster Vaccines- Shingrix (1 of 2) 01/03/2023 (Originally [birth year]/08/1990)   HIV Screening  10/03/2023 (Originally [birth year]/08/1986)   MAMMOGRAM  01/29/2024    Colonoscopy  04/13/2028   DTaP/Tdap/Td (2 - Td or Tdap) 10/02/2032   HPV VACCINES  Aged Out   Hepatitis C Screening  Discontinued   Discussed health benefits of physical activity, and encouraged her to engage in regular exercise appropriate for her age and condition. Advised to schedule mammogram 01/2023 PAP results requested from GYN  Problem List Items Addressed This Visit       Nervous and Auditory   Disrupted sleep-wake cycle    Chronic, no difficulty falling asleep, wakes up between 1-3am every night and unable to return to sleep, associated with intermittent restless legs. She has a bed-partner who snores, use of earplugs with some improvement.  no improvement with melatonin, and perimenopause OVER THE COUNTER supplement. No tobacco/nicotine, no ALCOHOL, no caffeine No daytime somnolence, no snoring, no apneic episodes, no HYPERTENSION, no large neck circumference.  We discussed use of hynoptic and possible side effects. She opted to use magnesium glycinate OVER THE COUNTER supplement at this time. F/up prn      Other Visit Diagnoses     Encounter for preventative adult health care exam with abnormal findings    -  Primary   Relevant Orders   Comprehensive metabolic panel (Completed)   Lipid panel (Completed)   Encounter for immunization       Relevant Orders   Tdap vaccine greater than or equal to 7yo IM (  Completed)   Encounter for lipid screening for cardiovascular disease       Relevant Orders   Lipid panel (Completed)      Return in about 1 year (around 10/03/2023) for CPE (fasting).     Alysia Penna, NP

## 2022-10-03 NOTE — Assessment & Plan Note (Addendum)
Chronic, no difficulty falling asleep, wakes up between 1-3am every night and unable to return to sleep, associated with intermittent restless legs. She has a bed-partner who snores, use of earplugs with some improvement.  no improvement with melatonin, and perimenopause OVER THE COUNTER supplement. No tobacco/nicotine, no ALCOHOL, no caffeine No daytime somnolence, no snoring, no apneic episodes, no HYPERTENSION, no large neck circumference.  We discussed use of hynoptic and possible side effects. She opted to use magnesium glycinate OVER THE COUNTER supplement at this time. F/up prn

## 2022-10-03 NOTE — Progress Notes (Signed)
Stable Follow instructions as discussed during office visit.

## 2022-12-04 DIAGNOSIS — N3020 Other chronic cystitis without hematuria: Secondary | ICD-10-CM | POA: Diagnosis not present

## 2022-12-17 ENCOUNTER — Other Ambulatory Visit: Payer: Self-pay | Admitting: Obstetrics and Gynecology

## 2022-12-17 DIAGNOSIS — Z1231 Encounter for screening mammogram for malignant neoplasm of breast: Secondary | ICD-10-CM

## 2023-02-03 ENCOUNTER — Ambulatory Visit
Admission: RE | Admit: 2023-02-03 | Discharge: 2023-02-03 | Disposition: A | Discharge status: Home / Self Care | Payer: BC Managed Care – PPO | Source: Ambulatory Visit | Attending: Obstetrics and Gynecology | Admitting: Obstetrics and Gynecology

## 2023-02-03 DIAGNOSIS — Z1231 Encounter for screening mammogram for malignant neoplasm of breast: Secondary | ICD-10-CM

## 2023-03-10 DIAGNOSIS — Z1331 Encounter for screening for depression: Secondary | ICD-10-CM | POA: Diagnosis not present

## 2023-03-10 DIAGNOSIS — N951 Menopausal and female climacteric states: Secondary | ICD-10-CM | POA: Diagnosis not present

## 2023-03-10 DIAGNOSIS — Z01419 Encounter for gynecological examination (general) (routine) without abnormal findings: Secondary | ICD-10-CM | POA: Diagnosis not present

## 2023-10-18 IMAGING — MG MM DIGITAL DIAGNOSTIC UNILAT*L* W/ TOMO W/ CAD
6 series · 6 of 18 positions shown · non-contrast
Comparison: Previous exam(s).

CLINICAL DATA: New mass felt by the patient in the inferior left
breast for the past 2.5 weeks.

EXAM:
DIGITAL DIAGNOSTIC UNILATERAL LEFT MAMMOGRAM WITH TOMOSYNTHESIS AND
CAD; ULTRASOUND LEFT BREAST LIMITED
TECHNIQUE: Left digital diagnostic mammography and breast tomosynthesis was
performed. The images were evaluated with computer-aided detection.;
Targeted ultrasound examination of the left breast was performed.

[L MLO synth-2D (1 of 2)]
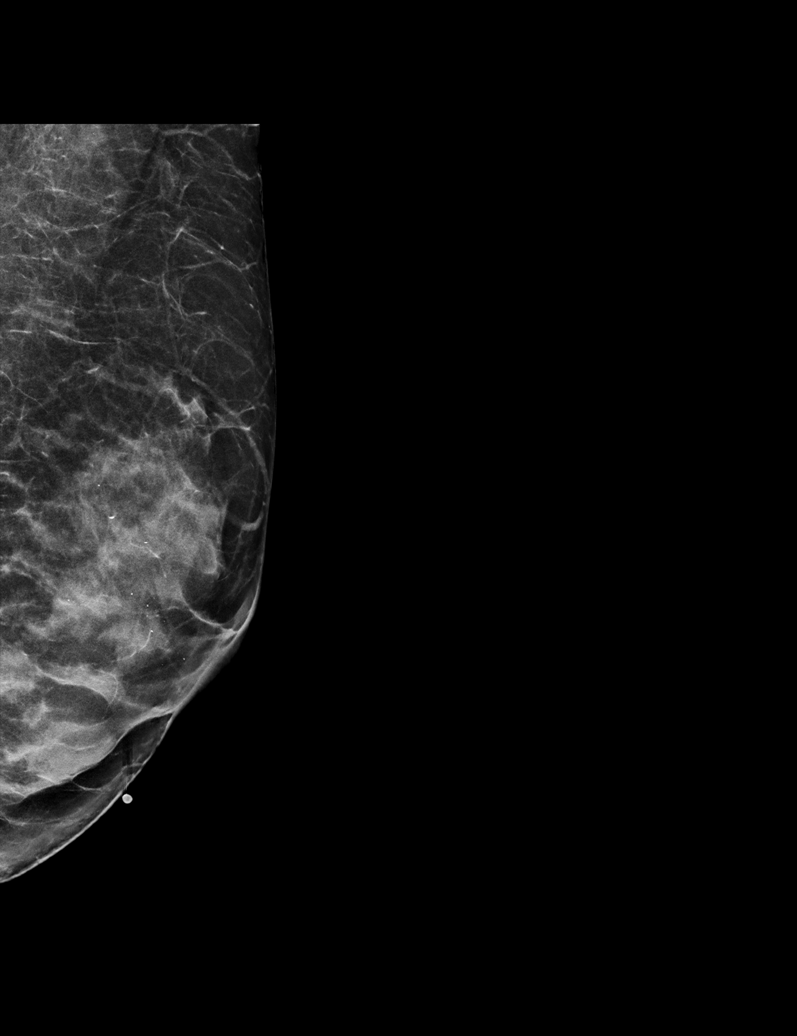

[L MLO synth-2D (2 of 2)]
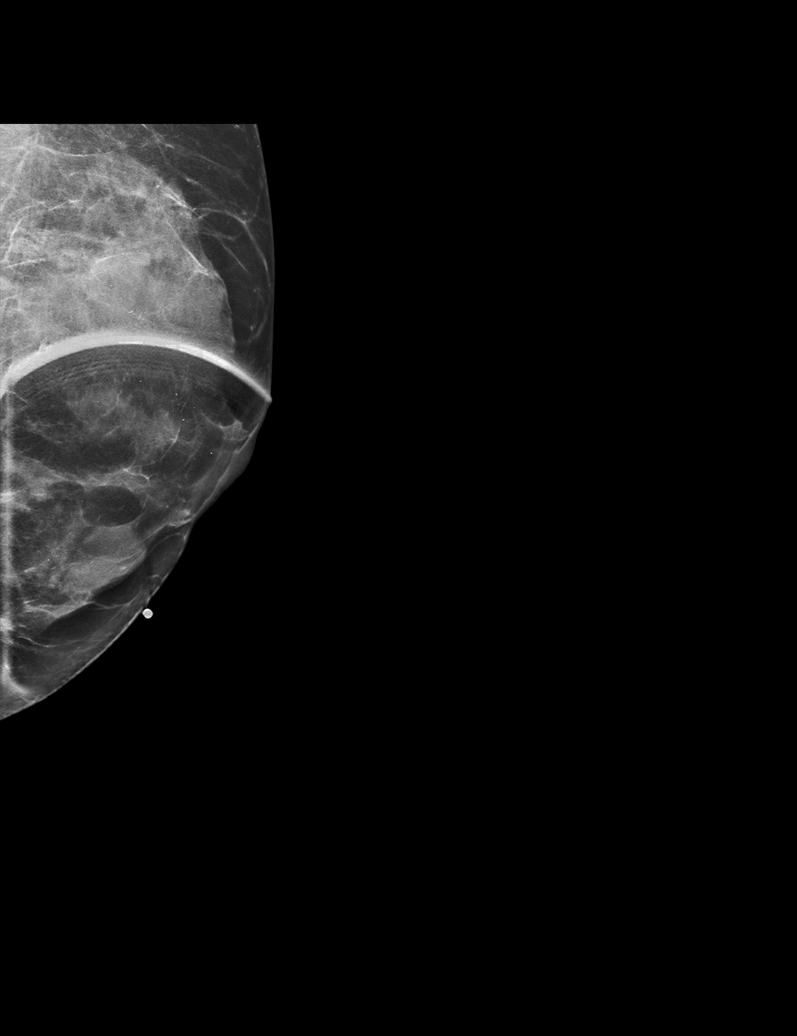

[L CC synth-2D]
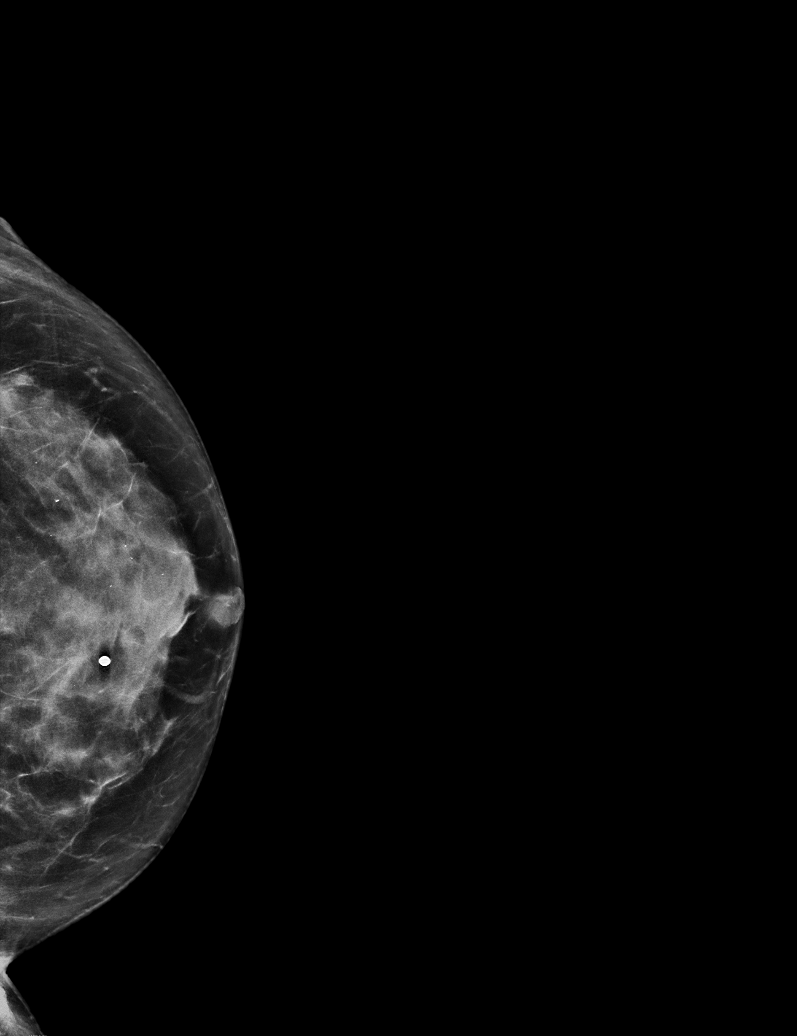

[L MLO tomo (1 of 2) · tomo slice 25/49.0]
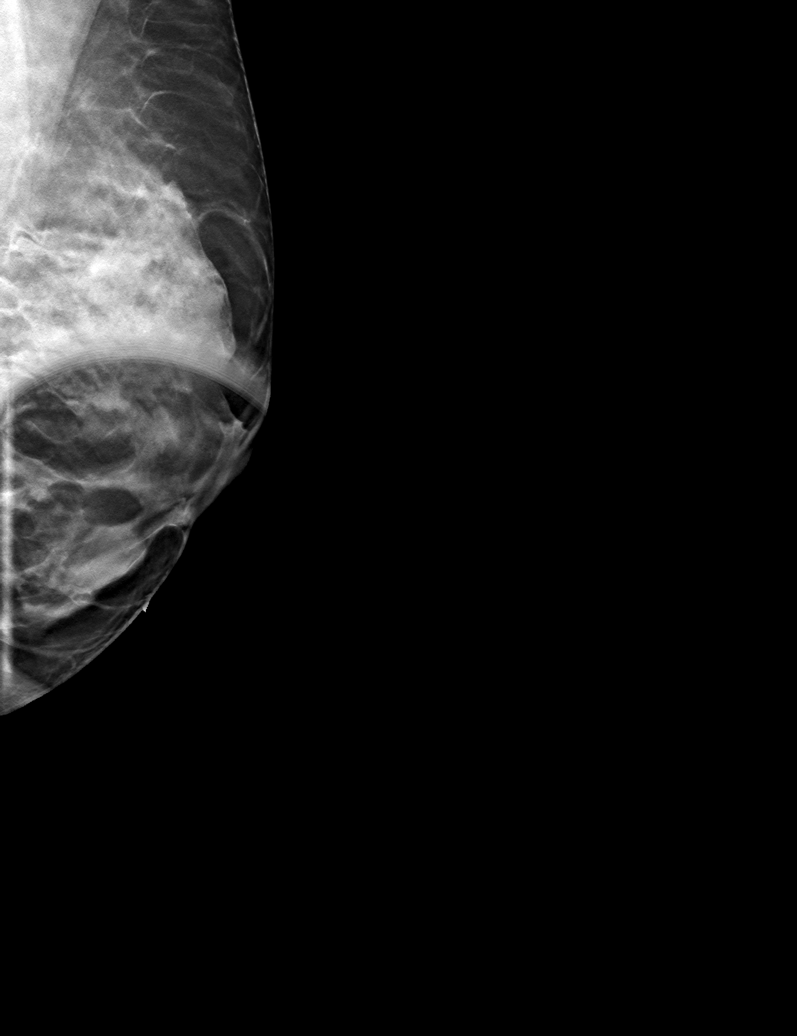

[L CC tomo · tomo slice 31/61.0]
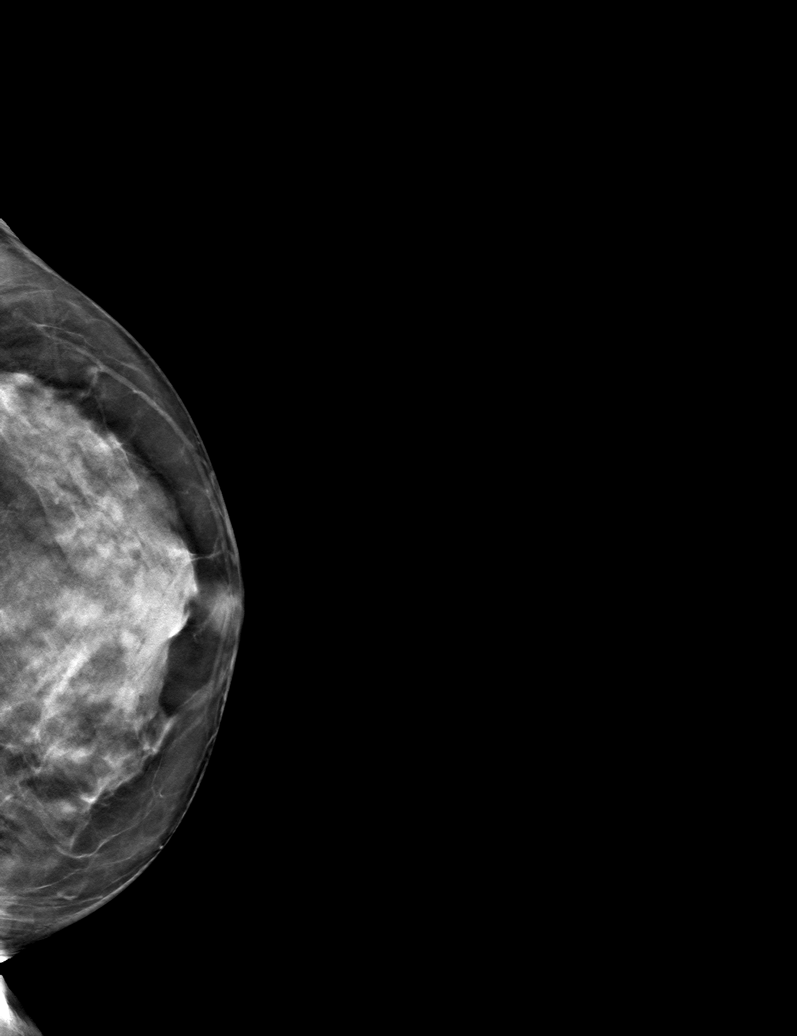

[L MLO tomo (2 of 2) · tomo slice 27/54.0]
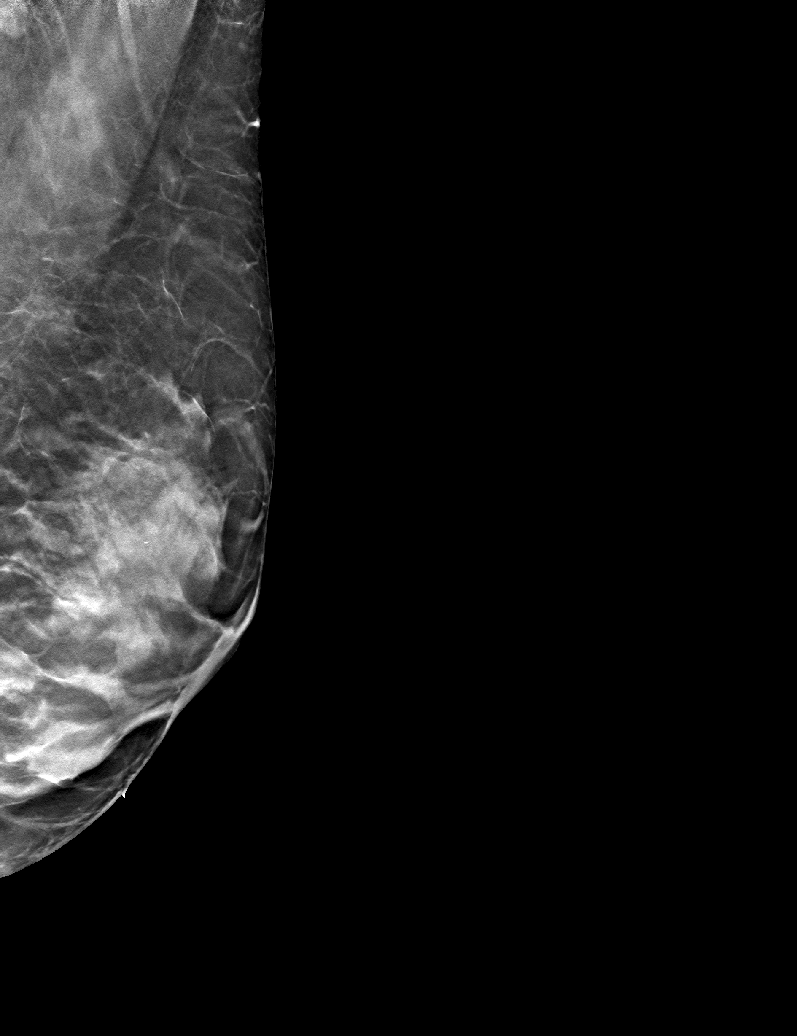

[6 of 18 positions shown; findings below may reference images not displayed]

ACR Breast Density Category c: The breast tissue is heterogeneously
dense, which may obscure small masses.
FINDINGS: An oval mass with some circumscribed and some obscured or indistinct
margins is demonstrated superficially in the inferior left breast at
the location of the mass felt by the patient, marked with a metallic
marker. No findings elsewhere in the breast suspicious for
malignancy.

On physical exam, the patient has a faintly palpable mass in the 8
o'clock position of the left breast, 2 cm from the nipple.

Targeted ultrasound is performed, showing a 1.9 cm cyst containing 2
thin internal septations in the 8 o'clock position of the left
breast, 2 cm from the nipple. This corresponds to the palpable and
mammographic mass.
IMPRESSION: Benign left breast cyst.  No evidence of malignancy.

RECOMMENDATION:
Bilateral screening mammogram in October 2021 when due.

I have discussed the findings and recommendations with the patient.
If applicable, a reminder letter will be sent to the patient
regarding the next appointment.

BI-RADS CATEGORY  2: Benign.

## 2023-12-24 ENCOUNTER — Other Ambulatory Visit: Payer: Self-pay | Admitting: Nurse Practitioner

## 2023-12-24 DIAGNOSIS — Z1231 Encounter for screening mammogram for malignant neoplasm of breast: Secondary | ICD-10-CM

## 2024-01-15 DIAGNOSIS — N390 Urinary tract infection, site not specified: Secondary | ICD-10-CM | POA: Diagnosis not present

## 2024-02-04 ENCOUNTER — Ambulatory Visit
Admission: RE | Admit: 2024-02-04 | Discharge: 2024-02-04 | Disposition: A | Discharge status: Home / Self Care | Source: Ambulatory Visit | Attending: Nurse Practitioner | Admitting: Nurse Practitioner

## 2024-02-04 DIAGNOSIS — Z1231 Encounter for screening mammogram for malignant neoplasm of breast: Secondary | ICD-10-CM
